# Patient Record
Sex: Female | Born: 1935 | Race: White | Hispanic: No | Smoking: Never smoker
Health system: Southern US, Academic
[De-identification: ages and names within clinical notes are randomized; demographics above are authoritative.]

## PROBLEM LIST (undated history)

## (undated) HISTORY — PX: HX APPENDECTOMY: SHX54

## (undated) HISTORY — PX: HX CATARACT REMOVAL: SHX102

## (undated) HISTORY — PX: HX TONSILLECTOMY: SHX27

---

## 1994-04-05 ENCOUNTER — Other Ambulatory Visit (HOSPITAL_COMMUNITY): Payer: Self-pay

## 2021-07-10 ENCOUNTER — Encounter (INDEPENDENT_AMBULATORY_CARE_PROVIDER_SITE_OTHER): Payer: Self-pay | Admitting: Nephrology

## 2021-07-10 DIAGNOSIS — Z131 Encounter for screening for diabetes mellitus: Secondary | ICD-10-CM

## 2021-07-10 DIAGNOSIS — I10 Essential (primary) hypertension: Secondary | ICD-10-CM

## 2021-07-10 DIAGNOSIS — E559 Vitamin D deficiency, unspecified: Secondary | ICD-10-CM

## 2021-07-10 DIAGNOSIS — E538 Deficiency of other specified B group vitamins: Secondary | ICD-10-CM

## 2021-07-10 DIAGNOSIS — D631 Anemia in chronic kidney disease: Secondary | ICD-10-CM

## 2021-07-10 DIAGNOSIS — N189 Chronic kidney disease, unspecified: Secondary | ICD-10-CM

## 2021-07-10 NOTE — Progress Notes (Cosign Needed)
This is an 87 year old white female patient history of diabetes mellitus type 2 hypertension chronic kidney disease stage IIIb last serum creatinine 1.4 with a GFR of 38 mL/min hemoglobin 13.9 so at this time asked the patient to drink plenty of fluids avoid NSAIDs keep sugar blood pressure control repeat lab work check kidney ultrasound see her back in  Labs::       Creatinine 1.4 mg/dL (0.3-1.4) 12/24/20 04:40 12/24/20       Glomerular Filtration Rate Calc 38 mL/min (>59)  L 12/24/20 04:40 12/24/20       BUN/Creatinine Ratio 19  (10-20) 12/24/20 04:40 12/24/20       Potassium Level 3.4 mEq/L (3.6-5.6)  L 12/24/20 04:40 12/24/20       Magnesium Level 1.7 mg/dL (1.8-2.5)  L 12/20/20 04:10 12/20/20       Parathyroid Hormone (Intact) 126.7 pg/mL (12-88)  H 12/21/20 09:35 12/21/20       Total Protein 5.6 g/dL (6.5-8.1)  L 12/24/20 04:40 12/24/20       Hemoglobin 13.4 g/dL (12.5-16.0) 12/24/20 04:40 12/24/20       Red Blood Count 4.32 M/uL (4.20-5.40) 12/24/20 04:40 12/24/20       Hemoglobin A1c Percent 5.9 % (4.3-6.1) 12/21/20 09:35 12/21/20       Urine Protein/Creatinine Ratio Not Reportable 12/21/20 16:50 12/21/20

## 2021-07-23 ENCOUNTER — Encounter (INDEPENDENT_AMBULATORY_CARE_PROVIDER_SITE_OTHER): Payer: Self-pay | Admitting: Nephrology

## 2021-09-25 ENCOUNTER — Encounter (INDEPENDENT_AMBULATORY_CARE_PROVIDER_SITE_OTHER): Payer: Self-pay | Admitting: Nephrology

## 2021-09-25 NOTE — Nursing Note (Signed)
Called patient on 09/25/21 at 10:20am no answer so i left a VM stating that we have not received her recent labs and we will have to R/S her appointment on 09/26/21 with Dr. Vivien Rossetti

## 2021-09-26 ENCOUNTER — Encounter (INDEPENDENT_AMBULATORY_CARE_PROVIDER_SITE_OTHER): Payer: Self-pay | Admitting: Nephrology

## 2023-01-19 IMAGING — MR MRI PELVIS WITHOUT CONTRAST
4 of 6 series · 21 of 48 positions shown · non-contrast
Comparison: CT and ultrasound examinations are not available at this time for comparison.  However, type-written reports of these two prior exams are available.

﻿EXAM:  [id number]   MRI PELVIS WITHOUT CONTRAST
INDICATION: Adnexal mass.
TECHNIQUE: Noncontrast multiplanar, multisequence MRI was performed.

[Series 7: T1 · axial · 6.5mm · 0.83mm/px · z∈[-203,+14]mm · 9 of 30 slices shown (1 of 2)]
[im 1/30]
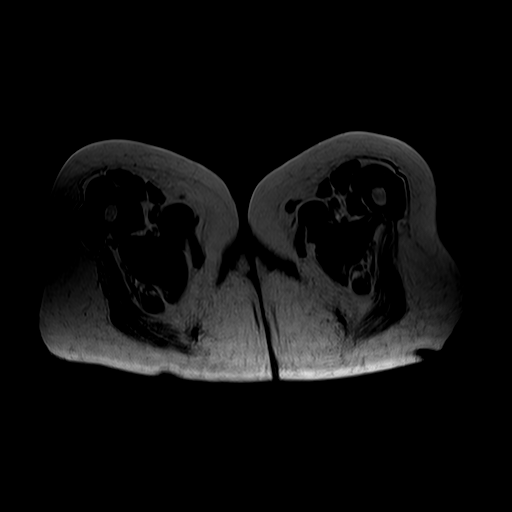
[im 4/30]
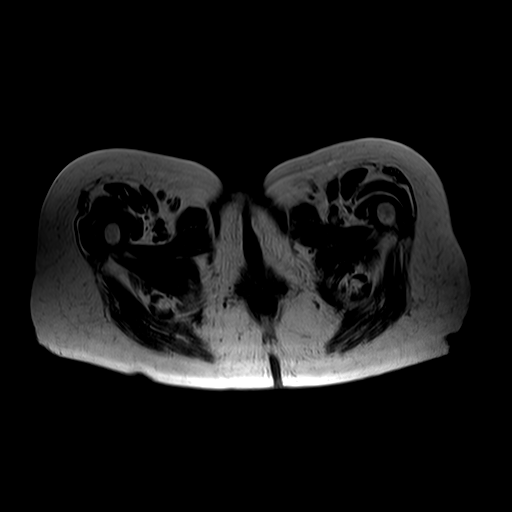
[im 8/30]
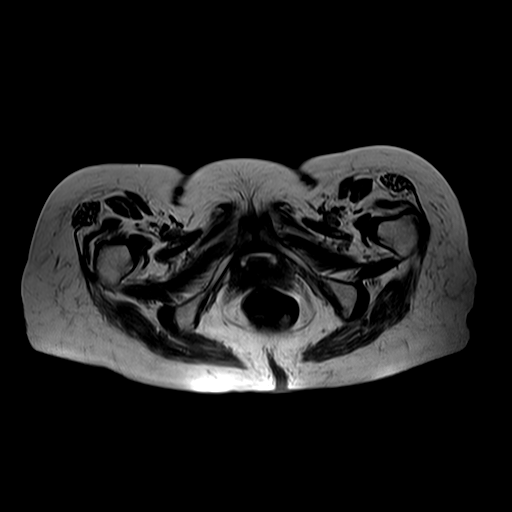
[im 11/30]
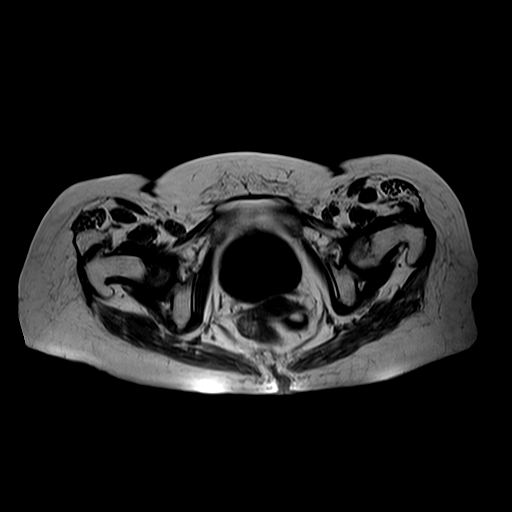
[im 15/30]
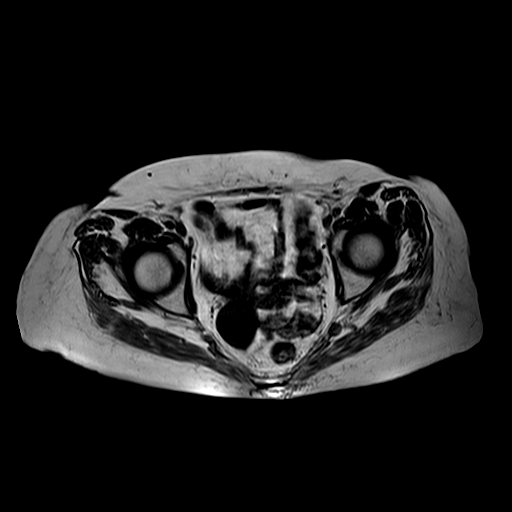
[im 19/30]
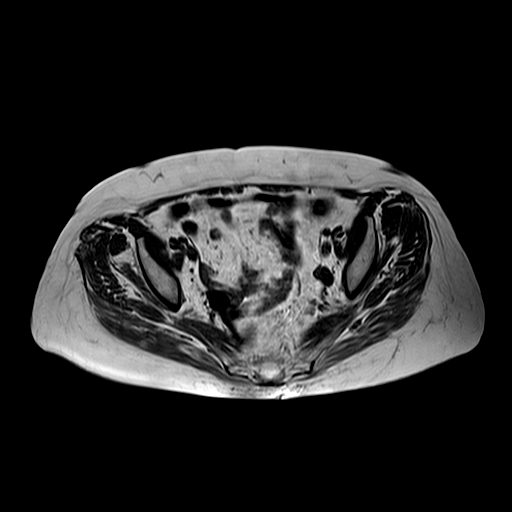
[im 22/30]
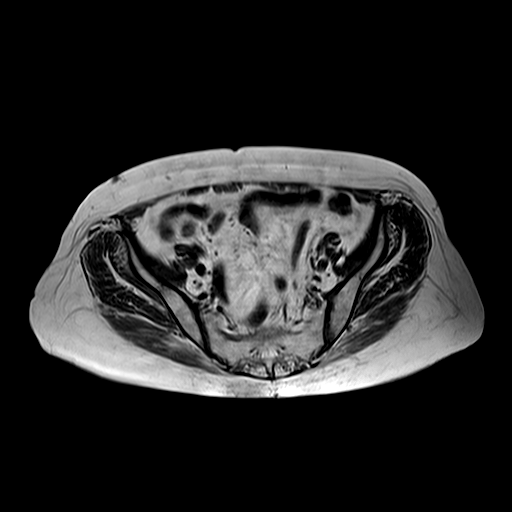
[im 26/30]
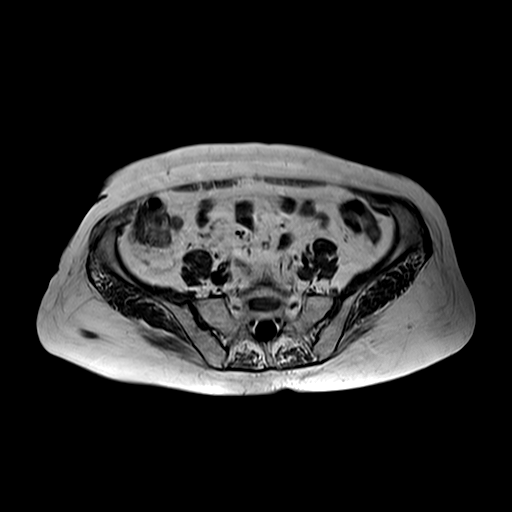
[im 30/30]
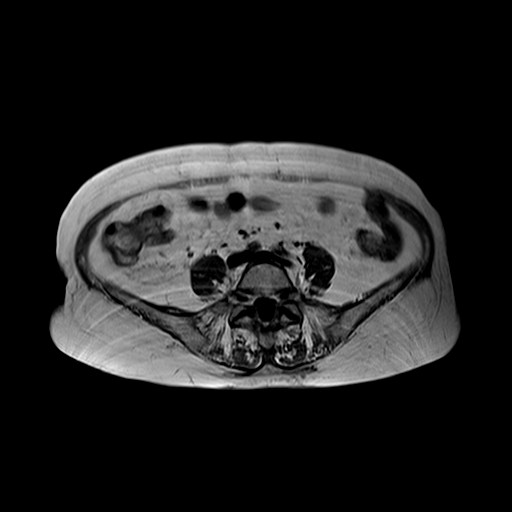

[Series 8: STIR · axial · 6.5mm · 0.83mm/px · z∈[-203,-24]mm · 6 of 30 slices shown]
[im 1/30]
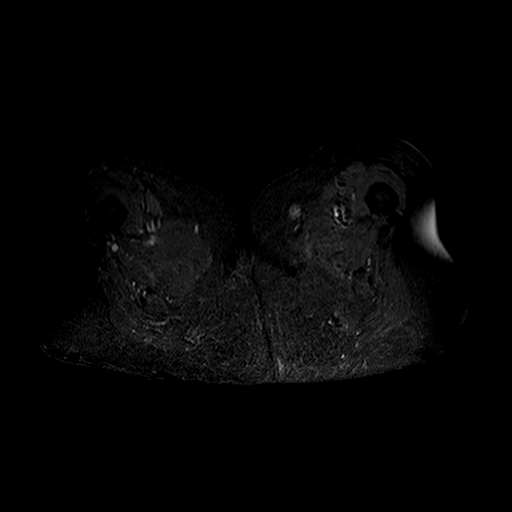
[im 5/30]
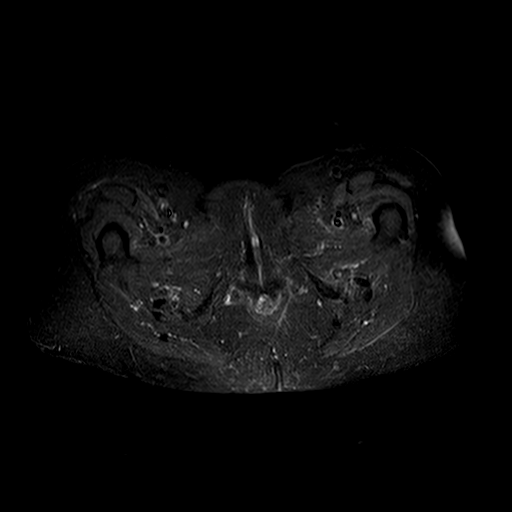
[im 9/30]
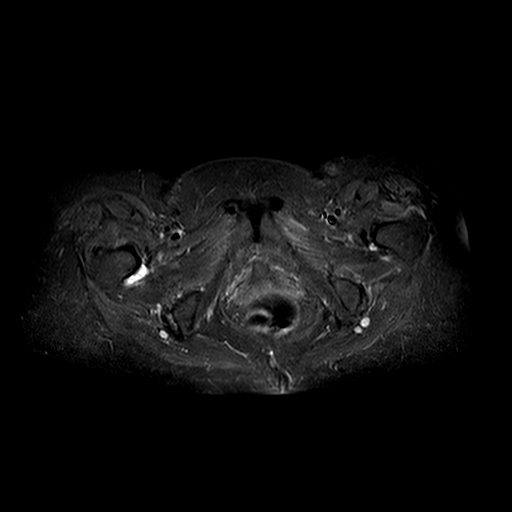
[im 13/30]
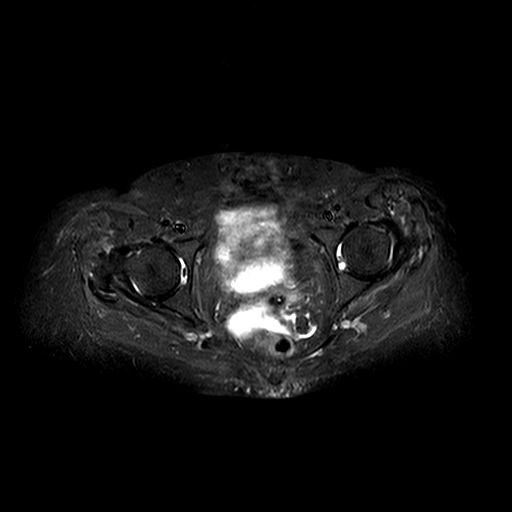
[im 17/30]
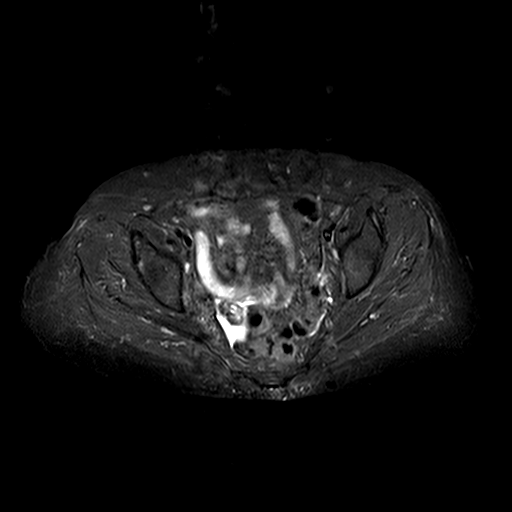
[im 25/30]
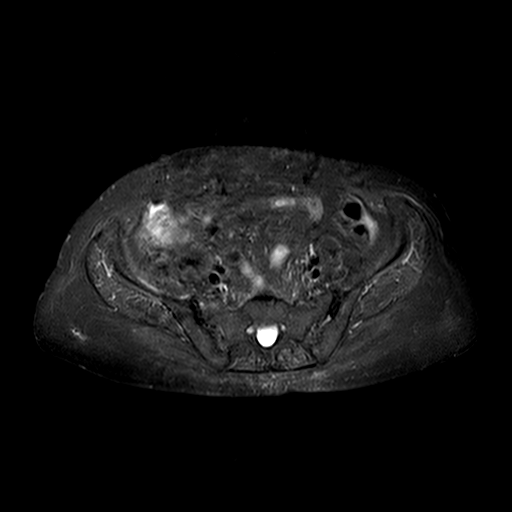

[Series 9: T2 fat-sat · axial · 6.5mm · 1.33mm/px · z∈[-173,-24]mm · 3 of 30 slices shown]
[im 5/30]
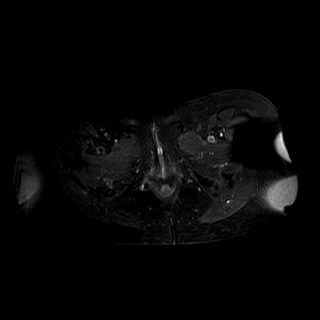
[im 17/30]
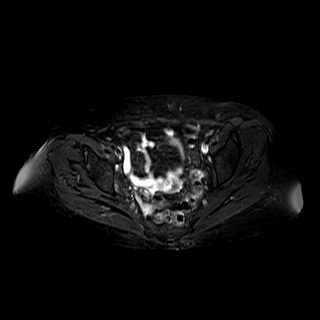
[im 25/30]
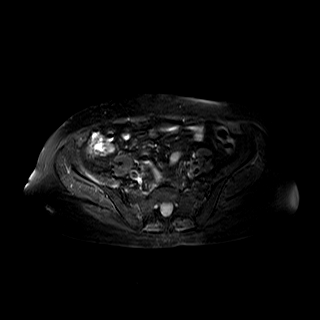

[Series 10: T1 · coronal · 6.5mm · 0.83mm/px · 3 of 28 slices shown (2 of 2)]
[im 4/28]
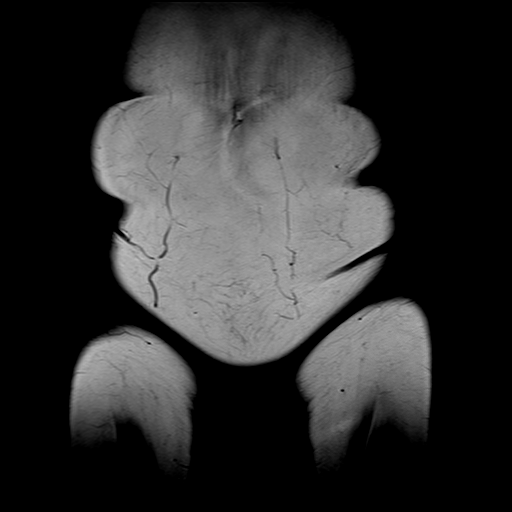
[im 16/28]
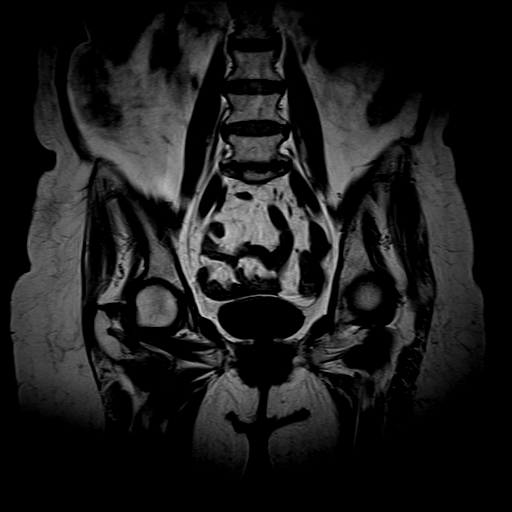
[im 24/28]
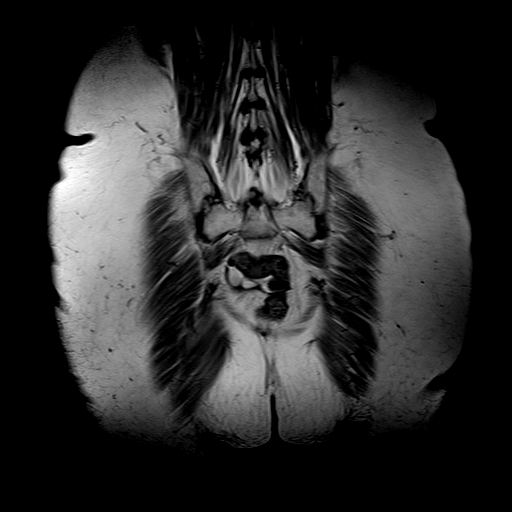

[21 of 48 positions shown; findings below may reference images not displayed]

FINDINGS: There are 3 right adnexal cysts measuring 2.1, 1.3, and 4.1 cm in diameter respectively.  These have the appearance of simple cysts with no septations or mural nodules.  There is no MRI evidence of malignancy.  

The previously described suspected left adnexal dermoid cyst is not clearly visualized on the current MRI examination. 

There is a small amount of free fluid in the posterior cul-de-sac.  

There are multiple small uterine fibroids.  The endometrium is not well visualized.  

There is minimal bilateral hip joint fluid with mild degenerative changes.
IMPRESSION: 1. Three right adnexal simple cysts. 

2. No MRI evidence of malignancy.
